# Patient Record
Sex: Female | Born: 1970 | Race: Black or African American | Hispanic: No | Marital: Single | Smoking: Former smoker
Health system: Southern US, Community
[De-identification: ages and names within clinical notes are randomized; demographics above are authoritative.]

## PROBLEM LIST (undated history)

## (undated) DIAGNOSIS — C50919 Malignant neoplasm of unspecified site of unspecified female breast: Secondary | ICD-10-CM

## (undated) HISTORY — PX: MASTECTOMY: SHX3

---

## 2004-01-19 ENCOUNTER — Emergency Department (HOSPITAL_COMMUNITY): Admission: EM | Admit: 2004-01-19 | Discharge: 2004-01-19 | Payer: Self-pay | Admitting: Emergency Medicine

## 2010-12-12 ENCOUNTER — Emergency Department (HOSPITAL_BASED_OUTPATIENT_CLINIC_OR_DEPARTMENT_OTHER)
Admission: EM | Admit: 2010-12-12 | Discharge: 2010-12-12 | Disposition: A | Discharge status: Home / Self Care | Payer: Self-pay | Attending: Emergency Medicine | Admitting: Emergency Medicine

## 2010-12-12 ENCOUNTER — Encounter: Payer: Self-pay | Admitting: *Deleted

## 2010-12-12 DIAGNOSIS — N61 Mastitis without abscess: Secondary | ICD-10-CM | POA: Insufficient documentation

## 2010-12-12 MED ORDER — CLINDAMYCIN HCL 150 MG PO CAPS: 150.0000 mg | ORAL_CAPSULE | Freq: Four times a day (QID) | ORAL | Status: DC

## 2010-12-12 MED ORDER — CLINDAMYCIN HCL 150 MG PO CAPS: 150.0000 mg | ORAL_CAPSULE | Freq: Four times a day (QID) | ORAL | Status: AC

## 2010-12-12 MED ORDER — HYDROCODONE-ACETAMINOPHEN 5-500 MG PO TABS: 1.0000 | ORAL_TABLET | Freq: Four times a day (QID) | ORAL | Status: AC | PRN

## 2010-12-12 MED ORDER — CLINDAMYCIN HCL 150 MG PO CAPS
300.0000 mg | ORAL_CAPSULE | Freq: Once | ORAL | Status: AC
  Administered 2010-12-12: 300 mg via ORAL
  Filled 2010-12-12: qty 2

## 2010-12-12 NOTE — ED Provider Notes (Signed)
History    Scribed for Ethelda Chick, MD, the patient was seen in room MH04/MH04. This chart was scribed by Katha Cabal.   CSN: [id number] Arrival date & time: 12/12/2010  4:41 PM   First MD Initiated Contact with Patient 12/12/10 1714      Chief Complaint  Patient presents with  . Breast Problem    (Consider location/radiation/quality/duration/timing/severity/associated sxs/prior treatment) HPI Jane Diaz is a 40 y.o. female who presents to the Emergency Department complaining of gradual worsening of persistent mild to moderate left breast lump for the past month. Lump is swollen with surrounding red.  Lump is located around left areola. There has been no drainage or nipple drainage.  Patient reports getting boils on bilateral breasts previously that had greenish odorous drainage.  Patient states that boils usually drained in the shower.  Patient reports current lump is hard and sore.  There has been no fever.  Patient does not have hx of DM or chronic medical problems.   Current lesion has been present for approx one month and gradually increasing.  No fever, nausea or other systemic illness.  No nipple drainage  LNMP: 11/28/2010  History reviewed. No pertinent past medical history.  History reviewed. No pertinent past surgical history.  History reviewed. No pertinent family history.  History  Substance Use Topics  . Smoking status: Former Games developer  . Smokeless tobacco: Not on file  . Alcohol Use: Yes    OB History    Grav Para Term Preterm Abortions TAB SAB Ect Mult Living                  Review of Systems 10 Systems reviewed and are negative for acute change except as noted in the HPI.  Allergies  Review of patient's allergies indicates no known allergies.  Home Medications   Current Outpatient Rx  Name Route Sig Dispense Refill  . CLINDAMYCIN HCL 150 MG PO CAPS Oral Take 1 capsule (150 mg total) by mouth every 6 (six) hours. 28 capsule 0  .  HYDROCODONE-ACETAMINOPHEN 5-500 MG PO TABS Oral Take 1-2 tablets by mouth every 6 (six) hours as needed for pain. 15 tablet 0    BP 101/64  Pulse 70  Temp(Src) 98.1 F (36.7 C) (Oral)  Resp 20  Ht 5\' 4"  (1.626 m)  Wt 130 lb (58.968 kg)  BMI 22.31 kg/m2  SpO2 100%  LMP 11/28/2010  Physical Exam  Constitutional: She is oriented to person, place, and time. She appears well-developed and well-nourished. No distress.  HENT:  Head: Normocephalic and atraumatic.  Eyes: EOM are normal. Pupils are equal, round, and reactive to light.  Neck: Normal range of motion. Neck supple.  Cardiovascular: Normal rate and intact distal pulses.   Pulmonary/Chest: Effort normal. No respiratory distress.  Abdominal: Soft. There is no tenderness.  Genitourinary:       Left breast at the 2 o'clock position: 2 cm firm abcess, no fluctuance with 3 cm area of surrounding erthema, no nipple discharge, mildly tender to palpation, no surrounding lymphadenopathy    Musculoskeletal: Normal range of motion.  Neurological: She is alert and oriented to person, place, and time.  Skin: Skin is warm and dry.  Psychiatric: She has a normal mood and affect. Her behavior is normal.    ED Course  Procedures (including critical care time)   DIAGNOSTIC STUDIES: Oxygen Saturation is 100% on room air, normal by my interpretation.    COORDINATION OF CARE:  5:50 PM  Physical exam  complete.  Advised patient to do warm compresses.  Will start patient on antibiotics and follow for ultrasound.      LABS / RADIOLOGY: Labs Reviewed - No data to display No results found.       MDM   MDM: Pt with area of cellulitis overlying left breast- firm nodule below- nonfluctuant, no nipple drainage, no assoicated lymphadenopathy.  Suspect cellulitis versus abscess- given location in breast under areola/nipple and nontoxic patient with no other comorbidities, will start on antibiotics.  Pt will need ultrasound to further  characterize area.  Ultrasound has been scheduled for tomorrow here at [facility].  Pt is agreeable with this plan and understands that the plan will be dependent on the findings of the ultrasound.  Pt given first dose of clindamycin in the ED tonight.  Discharged with strict return precautions.    MEDICATIONS GIVEN IN THE E.D. Scheduled Meds:    . clindamycin  300 mg Oral Once   Continuous Infusions:    IMPRESSION: 1. Cellulitis of breast       I personally performed the services described in this documentation, which was scribed in my presence. The recorded information has been reviewed and considered.           Ethelda Chick, MD 12/12/10 [phone]

## 2010-12-12 NOTE — ED Notes (Signed)
Pt states she has noticed a knot around her left areola. It sore and hard and has gradually gotten bigger over the past month.

## 2010-12-12 NOTE — ED Notes (Signed)
Left breast is red and swollen around the areola. Tender and warm to touch. Pt states she has noticed some other areas that have come and gone that have had green, foul-smelling drainage.

## 2010-12-13 ENCOUNTER — Other Ambulatory Visit: Payer: Self-pay | Admitting: Emergency Medicine

## 2010-12-13 ENCOUNTER — Ambulatory Visit (HOSPITAL_BASED_OUTPATIENT_CLINIC_OR_DEPARTMENT_OTHER): Payer: Self-pay

## 2010-12-13 ENCOUNTER — Ambulatory Visit
Admission: RE | Admit: 2010-12-13 | Discharge: 2010-12-13 | Disposition: A | Discharge status: Home / Self Care | Payer: No Typology Code available for payment source | Source: Ambulatory Visit | Attending: Emergency Medicine | Admitting: Emergency Medicine

## 2010-12-13 ENCOUNTER — Other Ambulatory Visit (HOSPITAL_BASED_OUTPATIENT_CLINIC_OR_DEPARTMENT_OTHER): Payer: Self-pay

## 2010-12-13 DIAGNOSIS — L0291 Cutaneous abscess, unspecified: Secondary | ICD-10-CM

## 2010-12-13 MED ORDER — CEPHALEXIN 500 MG PO CAPS: 500.0000 mg | ORAL_CAPSULE | Freq: Two times a day (BID) | ORAL | Status: AC

## 2010-12-27 ENCOUNTER — Ambulatory Visit
Admission: RE | Admit: 2010-12-27 | Discharge: 2010-12-27 | Disposition: A | Discharge status: Home / Self Care | Payer: No Typology Code available for payment source | Source: Ambulatory Visit | Attending: Emergency Medicine | Admitting: Emergency Medicine

## 2010-12-27 ENCOUNTER — Other Ambulatory Visit: Payer: Self-pay | Admitting: Emergency Medicine

## 2010-12-27 ENCOUNTER — Other Ambulatory Visit: Payer: Self-pay | Admitting: Diagnostic Radiology

## 2010-12-27 DIAGNOSIS — L0291 Cutaneous abscess, unspecified: Secondary | ICD-10-CM

## 2010-12-27 MED ORDER — CEPHALEXIN 500 MG PO CAPS: 500.0000 mg | ORAL_CAPSULE | Freq: Four times a day (QID) | ORAL | Status: AC

## 2011-01-10 ENCOUNTER — Ambulatory Visit
Admission: RE | Admit: 2011-01-10 | Discharge: 2011-01-10 | Disposition: A | Discharge status: Home / Self Care | Payer: No Typology Code available for payment source | Source: Ambulatory Visit | Attending: Emergency Medicine | Admitting: Emergency Medicine

## 2011-01-10 DIAGNOSIS — L0291 Cutaneous abscess, unspecified: Secondary | ICD-10-CM

## 2016-02-04 ENCOUNTER — Other Ambulatory Visit: Payer: Self-pay | Admitting: Surgery

## 2016-02-04 DIAGNOSIS — R9389 Abnormal findings on diagnostic imaging of other specified body structures: Secondary | ICD-10-CM

## 2016-02-04 DIAGNOSIS — N63 Unspecified lump in unspecified breast: Secondary | ICD-10-CM

## 2016-02-12 ENCOUNTER — Ambulatory Visit
Admission: RE | Admit: 2016-02-12 | Discharge: 2016-02-12 | Disposition: A | Discharge status: Home / Self Care | Payer: 59 | Source: Ambulatory Visit | Attending: Surgery | Admitting: Surgery

## 2016-02-12 ENCOUNTER — Other Ambulatory Visit: Payer: Self-pay | Admitting: Surgery

## 2016-02-12 DIAGNOSIS — N63 Unspecified lump in unspecified breast: Secondary | ICD-10-CM

## 2016-02-12 DIAGNOSIS — R9389 Abnormal findings on diagnostic imaging of other specified body structures: Secondary | ICD-10-CM

## 2016-02-12 MED ORDER — GADOBENATE DIMEGLUMINE 529 MG/ML IV SOLN
18.0000 mL | Freq: Once | INTRAVENOUS | Status: AC | PRN
  Administered 2016-02-12: 18 mL via INTRAVENOUS

## 2018-11-24 IMAGING — MR MR BREAST BX W LOC DEV EA ADD LESION IMAGE BX SPEC MR GUIDE*R*
6 of 9 series · 33 of 48 positions shown · IV contrast (18ml Multihance)
Comparison: Previous exams.

ADDENDUM:
Pathology revealed SCLEROSING LESION WITH APOCRINE METAPLASIA of the
Right breast, both locations, posterior upper inner and anterior
upper inner. The findings in both breast biopsies favor sampling of
an intraductal papilloma with sclerosis. This was found to be
concordant by Dr. Hoorvaath Ohr, with excision recommended for both areas.
Pathology results were discussed with the patient by telephone. The
patient reported doing well after the biopsies with tenderness at
the sites. Post biopsy instructions and care were reviewed and
questions were answered. The patient was encouraged to call The
Surgical consultation has been arranged with Dr. Maximciuc Layouni at
[HOSPITAL] [REDACTED] [REDACTED] in [HOSPITAL][HOSPITAL] on
February 22, 2016.

Pathology results reported by Ronson Nonato, RN on 02/16/2016.
CLINICAL DATA: 45-year-old female with abnormal suspicious
enhancement throughout the upper inner right breast. For tissue
sampling of the anterior aspect of this abnormal enhancement.
EXAM:
MRI GUIDED CORE NEEDLE BIOPSY OF THE RIGHT BREAST
TECHNIQUE: Multiplanar, multisequence MR imaging of the right breast was
performed both before and after administration of intravenous
contrast.
CONTRAST:  18mL MULTIHANCE GADOBENATE DIMEGLUMINE 529 MG/ML IV SOLN

[Series 2: axial pre-cm · axial · non-contrast · 1.3mm · 0.73mm/px · z∈[-77,+150]mm · 6 of 176 slices shown]
[im 1/176]
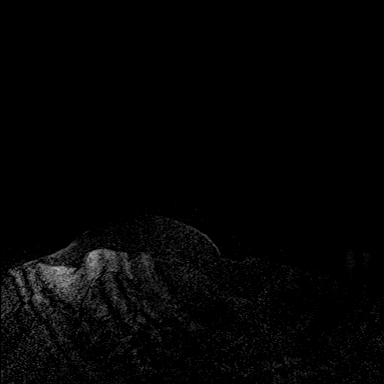
[im 36/176]
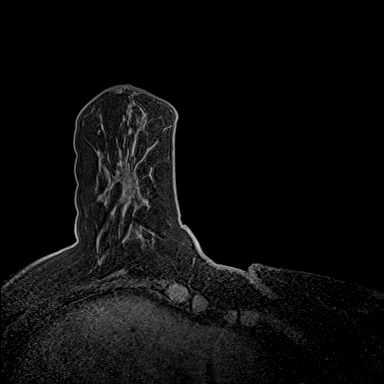
[im 71/176]
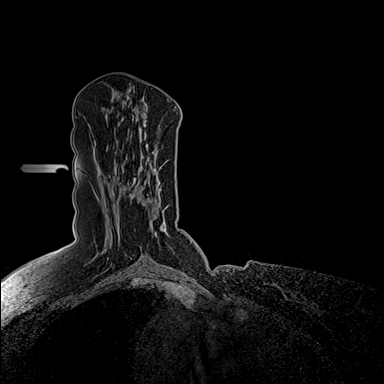
[im 106/176]
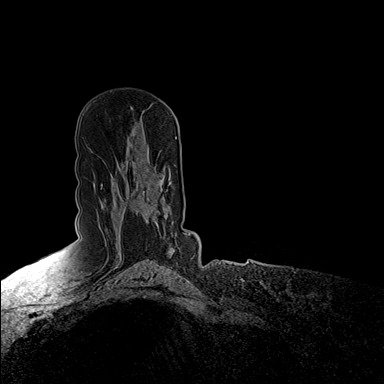
[im 141/176]
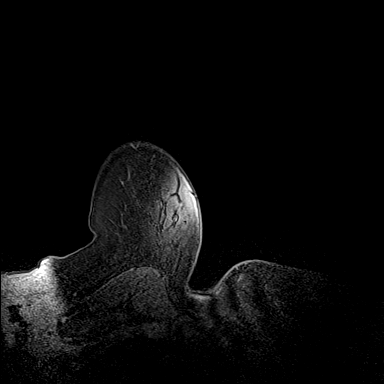
[im 176/176]
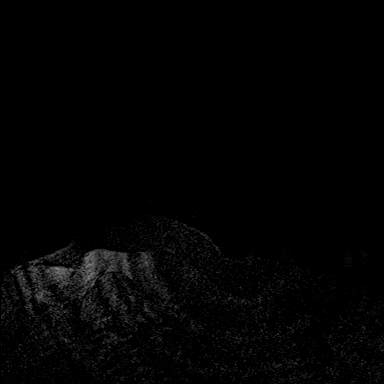

[Series 3: axial pre-cm_s2_(id) · axial · non-contrast · 1.3mm · 0.73mm/px · z∈[-77,+150]mm · 6 of 176 slices shown]
[im 1/176]
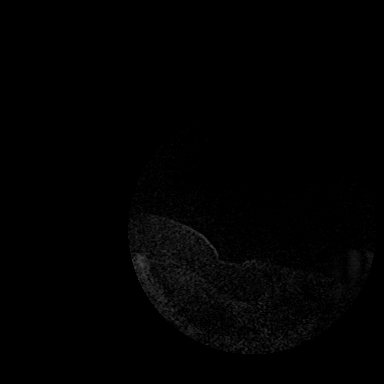
[im 36/176]
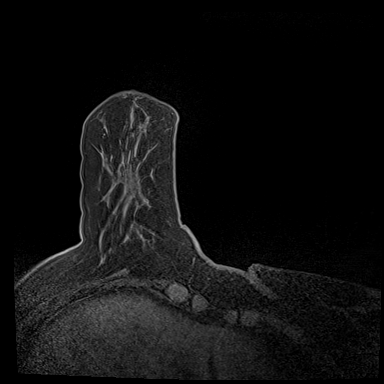
[im 71/176]
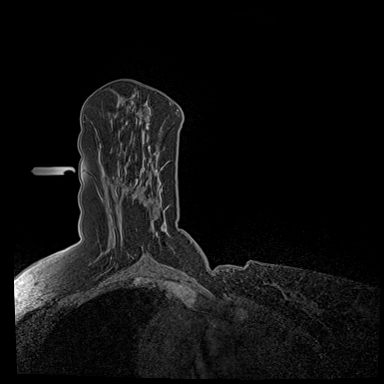
[im 106/176]
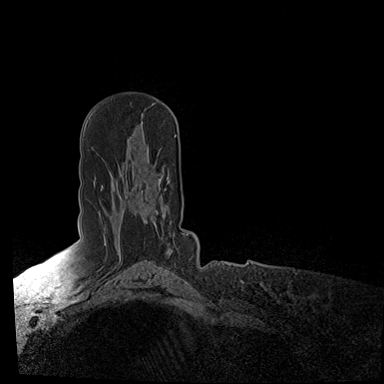
[im 141/176]
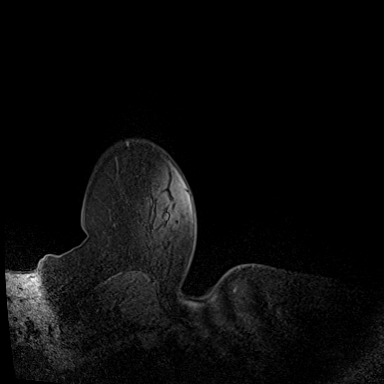
[im 176/176]
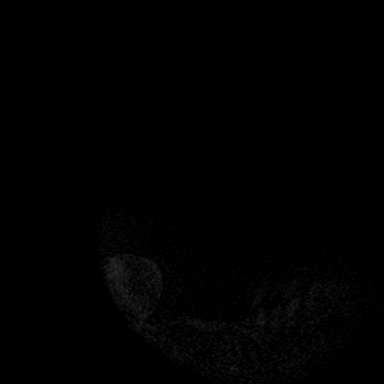

[Series 4: axial post 20 · axial · 1.3mm · 0.73mm/px · z∈[-77,+150]mm · 6 of 176 slices shown (1 of 3)]
[im 1/176]
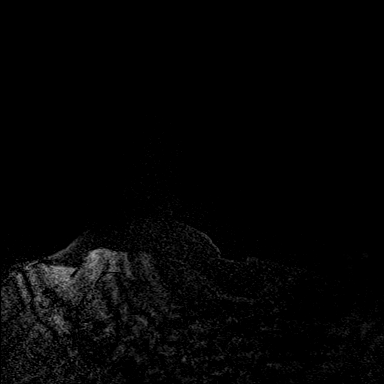
[im 36/176]
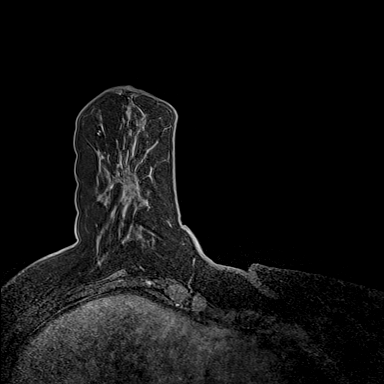
[im 71/176]
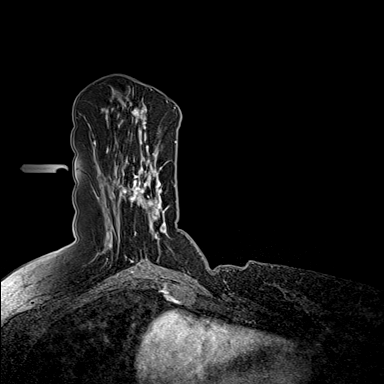
[im 106/176]
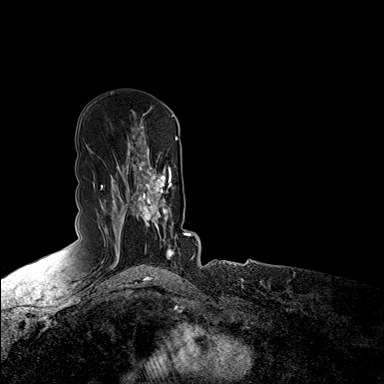
[im 141/176]
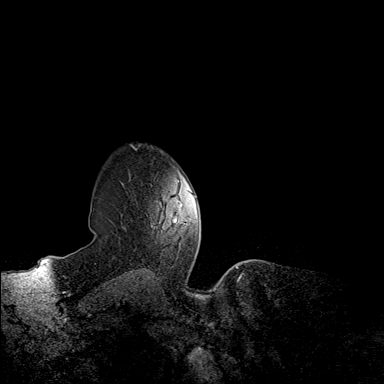
[im 176/176]
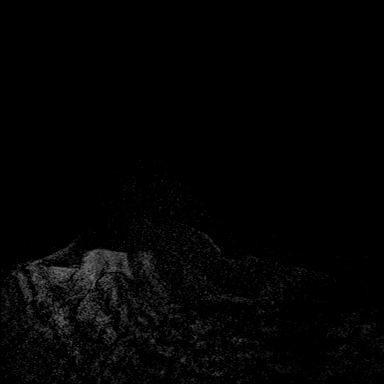

[Series 5: axial post 20 · axial · 1.3mm · 0.73mm/px · z∈[-77,+150]mm · 5 of 176 slices shown (2 of 3)]
[im 1/176]
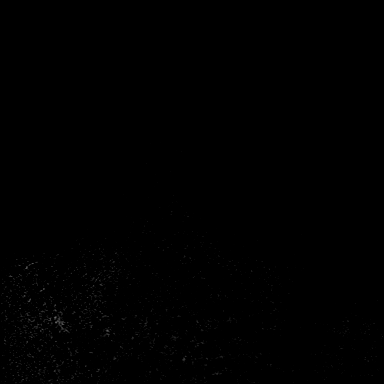
[im 44/176]
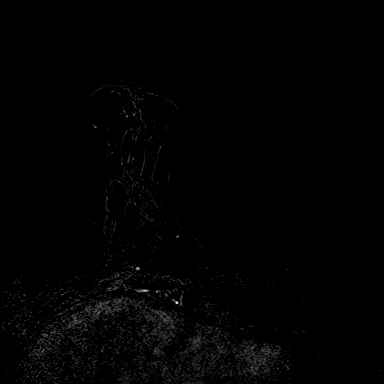
[im 88/176]
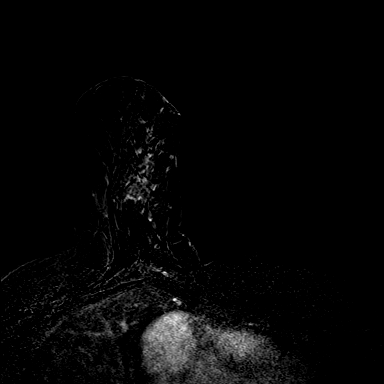
[im 132/176]
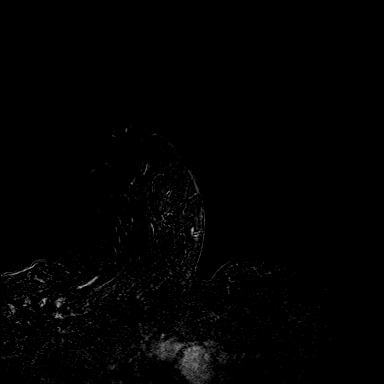
[im 176/176]
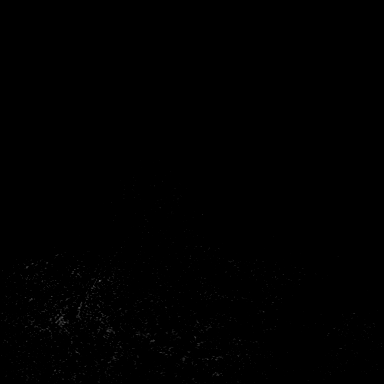

[Series 6: axial post 20 · axial · 1.3mm · 0.73mm/px · z∈[-77,+150]mm · 5 of 176 slices shown (3 of 3)]
[im 1/176]
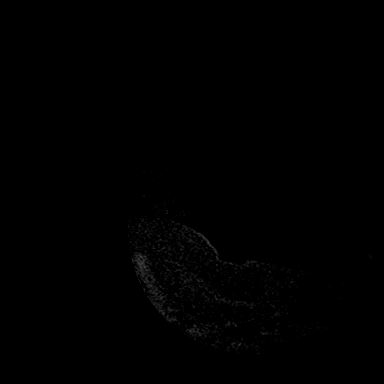
[im 44/176]
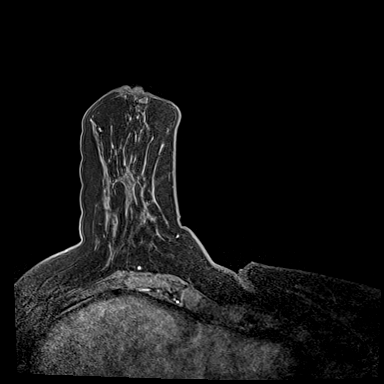
[im 88/176]
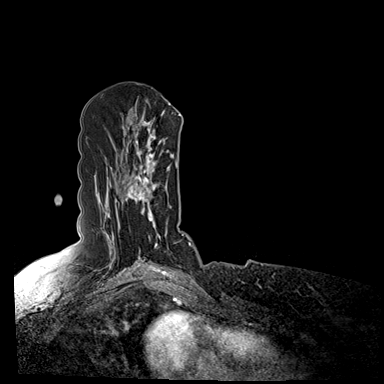
[im 132/176]
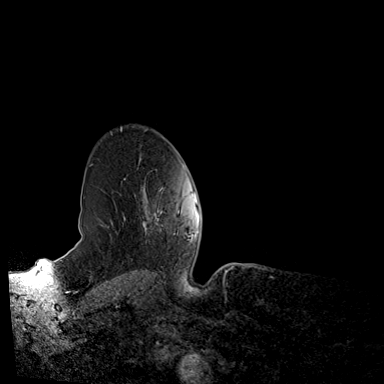
[im 176/176]
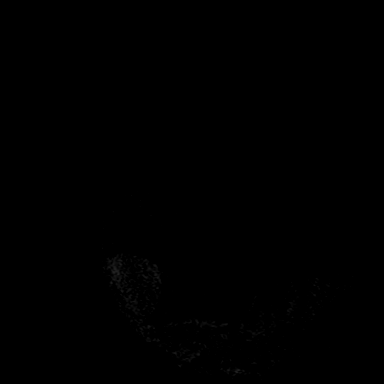

[Series 7: axial confirmation · axial · 1.3mm · 0.73mm/px · z∈[-77,+150]mm · 5 of 176 slices shown]
[im 1/176]
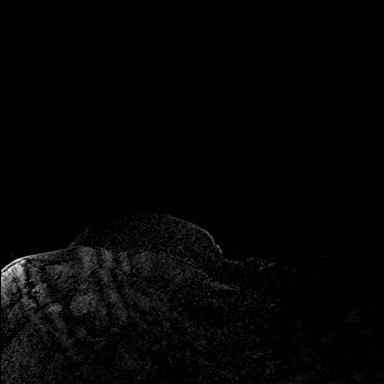
[im 44/176]
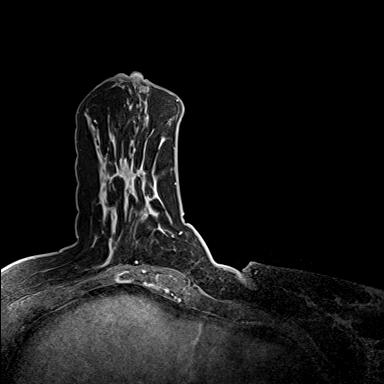
[im 88/176]
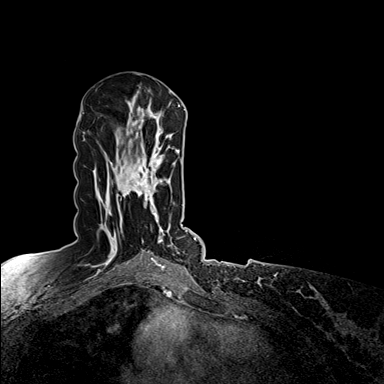
[im 132/176]
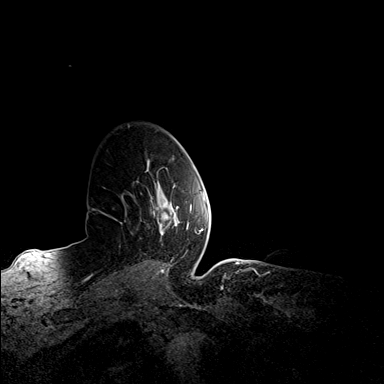
[im 176/176]
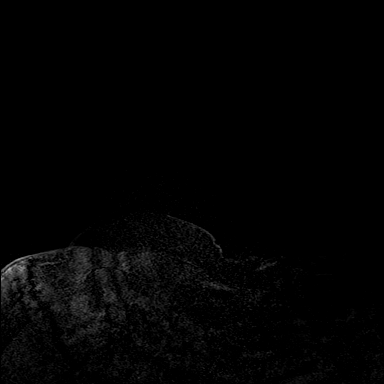

[33 of 48 positions shown; findings below may reference images not displayed]

FINDINGS: I met with the patient, and we discussed the procedure of MRI guided
biopsy, including risks, benefits, and alternatives. Specifically,
we discussed the risks of infection, bleeding, tissue injury, clip
migration, and inadequate sampling. Informed, written consent was
given. The usual time out protocol was performed immediately prior
to the procedure.

Using sterile technique, 1% Lidocaine, MRI guidance, and a 9 gauge
vacuum assisted device, biopsy was performed of the anterior aspect
of the abnormal enhancement throughout the upper inner right breast
using a lateral approach. At the conclusion of the procedure, a
cylinder-shaped tissue marker clip was deployed into the biopsy
cavity. Follow-up 2-view mammogram was performed and dictated
separately.
IMPRESSION: MRI guided biopsy of anterior aspect of abnormal enhancement
throughout the upper inner right breast. No apparent complications.

Pathology will be followed.

## 2019-12-23 ENCOUNTER — Emergency Department (HOSPITAL_BASED_OUTPATIENT_CLINIC_OR_DEPARTMENT_OTHER)
Admission: EM | Admit: 2019-12-23 | Discharge: 2019-12-23 | Disposition: A | Discharge status: Home / Self Care | Payer: BC Managed Care – PPO | Attending: Emergency Medicine | Admitting: Emergency Medicine

## 2019-12-23 ENCOUNTER — Other Ambulatory Visit: Payer: Self-pay

## 2019-12-23 ENCOUNTER — Encounter (HOSPITAL_BASED_OUTPATIENT_CLINIC_OR_DEPARTMENT_OTHER): Payer: Self-pay | Admitting: Emergency Medicine

## 2019-12-23 DIAGNOSIS — K0889 Other specified disorders of teeth and supporting structures: Secondary | ICD-10-CM | POA: Insufficient documentation

## 2019-12-23 DIAGNOSIS — Z87891 Personal history of nicotine dependence: Secondary | ICD-10-CM | POA: Insufficient documentation

## 2019-12-23 MED ORDER — KETOROLAC TROMETHAMINE 15 MG/ML IJ SOLN
30.0000 mg | Freq: Once | INTRAMUSCULAR | Status: AC
  Administered 2019-12-23: 30 mg via INTRAMUSCULAR
  Filled 2019-12-23: qty 2

## 2019-12-23 MED ORDER — NAPROXEN 500 MG PO TABS: 500.0000 mg | ORAL_TABLET | Freq: Two times a day (BID) | ORAL | 0 refills | Status: AC

## 2019-12-23 MED ORDER — PENICILLIN V POTASSIUM 500 MG PO TABS: 500.0000 mg | ORAL_TABLET | Freq: Four times a day (QID) | ORAL | 0 refills | Status: AC

## 2019-12-23 MED ORDER — PENICILLIN V POTASSIUM 250 MG PO TABS
500.0000 mg | ORAL_TABLET | Freq: Once | ORAL | Status: AC
  Administered 2019-12-23: 500 mg via ORAL
  Filled 2019-12-23: qty 2

## 2019-12-23 MED ORDER — HYDROCODONE-ACETAMINOPHEN 5-325 MG PO TABS
1.0000 | ORAL_TABLET | Freq: Once | ORAL | Status: DC
  Filled 2019-12-23: qty 1

## 2019-12-23 NOTE — Discharge Instructions (Addendum)
You were seen today for dental pain.  You will be treated for presumed infection.  It is important that you follow-up with dentistry for definitive care.  Take antibiotics as prescribed.  If you develop facial swelling or worsening pain, difficulty swallowing, or any new or worsening symptoms you should be reevaluated.

## 2019-12-23 NOTE — ED Triage Notes (Signed)
Reports pain on left upper and right lower mouth as well as right ear pain that started today.  Reports taking a bc with no relief.

## 2019-12-23 NOTE — ED Provider Notes (Signed)
[place] EMERGENCY DEPARTMENT Provider Note   CSN: [id number] Arrival date & time: 12/23/19  0250     History Chief Complaint  Patient presents with  . Dental Pain    Carlota Philley is a 49 y.o. female.  HPI     This is a 49 year old female with no reported past medical history presents with dental pain.  Patient reports 1 day history of worsening right lower jaw dental pain.  It radiates into her right ear.  She rates her pain at 10 out of 10.  She has taken [facility] powder with minimal relief.  She does not have a dentist and denies any recent dental procedures.  No fevers or difficulty swallowing.  History reviewed. No pertinent past medical history.  There are no problems to display for this patient.   No past surgical history on file.   OB History   No obstetric history on file.     No family history on file.  Social History   Tobacco Use  . Smoking status: Former Smoker  Substance Use Topics  . Alcohol use: Yes  . Drug use: No    Home Medications Prior to Admission medications   Not on File    Allergies    Patient has no known allergies.  Review of Systems   Review of Systems  Constitutional: Negative for fever.  HENT: Positive for dental problem. Negative for facial swelling and trouble swallowing.   All other systems reviewed and are negative.   Physical Exam Updated Vital Signs BP (!) 160/132 (BP Location: Right Arm) Comment: unable to get an accurate bp due to patient thrashing around  Pulse 88   Temp 97.8 F (36.6 C) (Oral)   Resp 18   Ht 1.6 m (5\' 3" )   Wt 77.1 kg   SpO2 100%   BMI 30.11 kg/m   Physical Exam Vitals and nursing note reviewed.  Constitutional:      Appearance: She is well-developed.     Comments: Uncomfortable appearing but nontoxic  HENT:     Head: Normocephalic and atraumatic.     Mouth/Throat:     Mouth: Mucous membranes are moist.     Comments: Generally poor dentition with multiple missing teeth  and dental caries noted, there is tenderness to palpation along the periapical region of the right lower gumline, no palpable abscess, no trismus or fullness noted under the tongue Eyes:     Pupils: Pupils are equal, round, and reactive to light.  Cardiovascular:     Rate and Rhythm: Normal rate and regular rhythm.     Heart sounds: Normal heart sounds.  Pulmonary:     Effort: Pulmonary effort is normal. No respiratory distress.     Breath sounds: No wheezing.  Abdominal:     Palpations: Abdomen is soft.  Musculoskeletal:     Cervical back: Neck supple.  Skin:    General: Skin is warm and dry.  Neurological:     Mental Status: She is alert and oriented to person, place, and time.  Psychiatric:        Mood and Affect: Mood normal.     ED Results / Procedures / Treatments   Labs (all labs ordered are listed, but only abnormal results are displayed) Labs Reviewed - No data to display  EKG None  Radiology No results found.  Procedures Procedures (including critical care time)  Medications Ordered in ED Medications  ketorolac (TORADOL) 15 MG/ML injection 30 mg (has no administration in  time range)  penicillin v potassium (VEETID) tablet 500 mg (has no administration in time range)  HYDROcodone-acetaminophen (NORCO/VICODIN) 5-325 MG per tablet 1 tablet (has no administration in time range)    ED Course  I have reviewed the triage vital signs and the nursing notes.  Pertinent labs & imaging results that were available during my care of the patient were reviewed by me and considered in my medical decision making (see chart for details).    MDM Rules/Calculators/A&P                          Patient presents with dental pain.  Overall nontoxic and vital signs are reassuring.  ABCs are intact.  No palpable or drainable abscess noted all the likely underlying infection.  No evidence of deep space infection such as Ludwigs.  Patient was given Toradol and Percocet.  Will  discharge with naproxen and penicillin to treat for presumed underlying infection.  Patient was given dental resources.  After history, exam, and medical workup I feel the patient has been appropriately medically screened and is safe for discharge home. Pertinent diagnoses were discussed with the patient. Patient was given return precautions.  Final Clinical Impression(s) / ED Diagnoses Final diagnoses:  Pain, dental    Rx / DC Orders ED Discharge Orders    None       Gina Costilla, Barbette Hair, MD 12/23/19 [phone]

## 2019-12-23 NOTE — ED Notes (Addendum)
Pt right lower molar pain. States having trouble speaking due to pain.

## 2021-12-15 ENCOUNTER — Encounter (HOSPITAL_BASED_OUTPATIENT_CLINIC_OR_DEPARTMENT_OTHER): Payer: Self-pay | Admitting: Emergency Medicine

## 2021-12-15 ENCOUNTER — Emergency Department (HOSPITAL_BASED_OUTPATIENT_CLINIC_OR_DEPARTMENT_OTHER)
Admission: EM | Admit: 2021-12-15 | Discharge: 2021-12-16 | Disposition: A | Discharge status: Home / Self Care | Payer: BC Managed Care – PPO | Attending: Emergency Medicine | Admitting: Emergency Medicine

## 2021-12-15 DIAGNOSIS — R059 Cough, unspecified: Secondary | ICD-10-CM | POA: Diagnosis present

## 2021-12-15 DIAGNOSIS — J069 Acute upper respiratory infection, unspecified: Secondary | ICD-10-CM | POA: Diagnosis not present

## 2021-12-15 HISTORY — DX: Malignant neoplasm of unspecified site of unspecified female breast: C50.919

## 2021-12-15 MED ORDER — CEFTRIAXONE SODIUM 500 MG IJ SOLR
500.0000 mg | Freq: Once | INTRAMUSCULAR | Status: AC
  Administered 2021-12-16: 500 mg via INTRAMUSCULAR
  Filled 2021-12-15: qty 500

## 2021-12-15 MED ORDER — AZITHROMYCIN 250 MG PO TABS
1000.0000 mg | ORAL_TABLET | Freq: Once | ORAL | Status: AC
  Administered 2021-12-16: 1000 mg via ORAL
  Filled 2021-12-15: qty 4

## 2021-12-15 MED ORDER — AMOXICILLIN-POT CLAVULANATE 875-125 MG PO TABS: 1.0000 | ORAL_TABLET | Freq: Two times a day (BID) | ORAL | 0 refills | Status: AC

## 2021-12-15 MED ORDER — BENZONATATE 100 MG PO CAPS: 100.0000 mg | ORAL_CAPSULE | Freq: Three times a day (TID) | ORAL | 0 refills | Status: AC

## 2021-12-15 MED ORDER — ONDANSETRON 4 MG PO TBDP: ORAL_TABLET | ORAL | 0 refills | Status: AC

## 2021-12-15 NOTE — Discharge Instructions (Signed)
Take tylenol 2 pills 4 times a day and motrin 4 pills 3 times a day.  Drink plenty of fluids.  Return for worsening shortness of breath, headache, confusion. Follow up with your family doctor.   I have sent to your tests off for the common sexually transmitted diseases.  This typically takes about 48 hours or so.  If you have MyChart you can check the results on your own.

## 2021-12-15 NOTE — ED Triage Notes (Signed)
Pt states for the past two weeks she has had cold symptoms with congestion and cough  Pt has been using mucinex and ginger tea  Pt states she has been coughing up green stuff  and is hoarse  Pt also states she has been having vaginal discharge that is clear in color, vaginal odor and mild itching

## 2021-12-16 LAB — WET PREP, GENITAL
Clue Cells Wet Prep HPF POC: NONE SEEN
Sperm: NONE SEEN
Trich, Wet Prep: NONE SEEN
WBC, Wet Prep HPF POC: >=10 — AB (ref <10)
Yeast Wet Prep HPF POC: NONE SEEN

## 2021-12-16 LAB — HIV ANTIBODY (ROUTINE TESTING W REFLEX): HIV Screen 4th Generation wRfx: NONREACTIVE

## 2021-12-16 MED ORDER — LIDOCAINE HCL (PF) 1 % IJ SOLN
INTRAMUSCULAR | Status: AC
  Filled 2021-12-16: qty 5

## 2021-12-16 NOTE — ED Provider Notes (Signed)
[place] [facility] Provider Note   CSN: [id number] Arrival date & time: 12/15/21  2318     History  Chief Complaint  Patient presents with   multiple complaints    Jane Diaz is a 51 y.o. female.  51 yo F with a chief complaints of cough and congestion.  This been going on for a couple weeks primarily consisting of cough.  She also feels like she might have contracted a sexually transmitted disease.  Feels like she had a change in her discharge and it has a foul odor.  She denies any abdominal pain.  Denies nausea denies vomiting.        Home Medications Prior to Admission medications   Medication Sig Start Date End Date Taking? Authorizing Provider  amoxicillin-clavulanate (AUGMENTIN) 875-125 MG tablet Take 1 tablet by mouth every 12 (twelve) hours. 12/15/21  Yes Deno Etienne, DO  benzonatate (TESSALON) 100 MG capsule Take 1 capsule (100 mg total) by mouth every 8 (eight) hours. 12/15/21  Yes Deno Etienne, DO  ondansetron (ZOFRAN-ODT) 4 MG disintegrating tablet '4mg'$  ODT q4 hours prn nausea/vomit 12/15/21  Yes Deno Etienne, DO  naproxen (NAPROSYN) 500 MG tablet Take 1 tablet (500 mg total) by mouth 2 (two) times daily. 12/23/19   Horton, Barbette Hair, MD  penicillin v potassium (VEETID) 500 MG tablet Take 1 tablet (500 mg total) by mouth 4 (four) times daily. 12/23/19   Horton, Barbette Hair, MD      Allergies    Patient has no known allergies.    Review of Systems   Review of Systems  Physical Exam Updated Vital Signs BP (!) 146/87 (BP Location: Left Arm)   Pulse 87   Temp 98.2 F (36.8 C) (Oral)   Resp 18   Ht '5\' 4"'$  (1.626 m)   Wt 92.5 kg   LMP 11/28/2010   SpO2 95%   BMI 35.02 kg/m  Physical Exam Vitals and nursing note reviewed.  Constitutional:      General: She is not in acute distress.    Appearance: She is well-developed. She is not diaphoretic.  HENT:     Head: Normocephalic and atraumatic.     Comments: Swollen turbinates, posterior  nasal drip, right maxillary sinus is exquisitely tender to percussion, tm normal bilaterally.   Eyes:     Pupils: Pupils are equal, round, and reactive to light.  Cardiovascular:     Rate and Rhythm: Normal rate and regular rhythm.     Heart sounds: No murmur heard.    No friction rub. No gallop.  Pulmonary:     Effort: Pulmonary effort is normal.     Breath sounds: No wheezing or rales.  Abdominal:     General: There is no distension.     Palpations: Abdomen is soft.     Tenderness: There is no abdominal tenderness.  Musculoskeletal:        General: No tenderness.     Cervical back: Normal range of motion and neck supple.  Skin:    General: Skin is warm and dry.  Neurological:     Mental Status: She is alert and oriented to person, place, and time.  Psychiatric:        Behavior: Behavior normal.     ED Results / Procedures / Treatments   Labs (all labs ordered are listed, but only abnormal results are displayed) Labs Reviewed  WET PREP, GENITAL - Abnormal; Notable for the following components:      Result Value  WBC, Wet Prep HPF POC >=10 (*)    All other components within normal limits  RPR  HIV ANTIBODY (ROUTINE TESTING W REFLEX)  GC/CHLAMYDIA PROBE AMP ([place]) NOT AT [facility]    EKG None  Radiology No results found.  Procedures Procedures    Medications Ordered in ED Medications  cefTRIAXone (ROCEPHIN) injection 500 mg (500 mg Intramuscular Given 12/16/21 0016)  azithromycin (ZITHROMAX) tablet 1,000 mg (1,000 mg Oral Given 12/16/21 0014)  lidocaine (PF) (XYLOCAINE) 1 % injection (  Given 12/16/21 0022)    ED Course/ Medical Decision Making/ A&P                           Medical Decision Making Amount and/or Complexity of Data Reviewed Labs: ordered.  Risk Prescription drug management.   51 yo F with a chief complaints of cough and congestion.  Is been going on for a couple weeks.  Clinically the patient has sinusitis.  Has exquisite tenderness  over the right maxillary sinus.  We will treat with antibiotics.  Patient also has a secondary complaint is complaining of concern for possible STD.  We will have the patient self swab here.  We will treat presumptively for gonorrhea and chlamydia.  Wet prep negative.  Discharge home.  1:16 AM:  I have discussed the diagnosis/risks/treatment options with the patient.  Evaluation and diagnostic testing in the emergency department does not suggest an emergent condition requiring admission or immediate intervention beyond what has been performed at this time.  They will follow up with PCP. We also discussed returning to the ED immediately if new or worsening sx occur. We discussed the sx which are most concerning (e.g., sudden worsening pain, fever, inability to tolerate by mouth) that necessitate immediate return. Medications administered to the patient during their visit and any new prescriptions provided to the patient are listed below.  Medications given during this visit Medications  cefTRIAXone (ROCEPHIN) injection 500 mg (500 mg Intramuscular Given 12/16/21 0016)  azithromycin (ZITHROMAX) tablet 1,000 mg (1,000 mg Oral Given 12/16/21 0014)  lidocaine (PF) (XYLOCAINE) 1 % injection (  Given 12/16/21 0022)     The patient appears reasonably screen and/or stabilized for discharge and I doubt any other medical condition or other [facility] requiring further screening, evaluation, or treatment in the ED at this time prior to discharge.          Final Clinical Impression(s) / ED Diagnoses Final diagnoses:  URI with cough and congestion    Rx / DC Orders ED Discharge Orders          Ordered    amoxicillin-clavulanate (AUGMENTIN) 875-125 MG tablet  Every 12 hours        12/15/21 2346    benzonatate (TESSALON) 100 MG capsule  Every 8 hours        12/15/21 2346    ondansetron (ZOFRAN-ODT) 4 MG disintegrating tablet        12/15/21 2346              Deno Etienne, DO 12/16/21 0116

## 2021-12-17 LAB — GC/CHLAMYDIA PROBE AMP (~~LOC~~) NOT AT ARMC
Chlamydia: NEGATIVE
Comment: NEGATIVE
Comment: NORMAL
Neisseria Gonorrhea: NEGATIVE

## 2021-12-17 LAB — RPR: RPR Ser Ql: NONREACTIVE

## 2022-09-14 ENCOUNTER — Encounter (HOSPITAL_BASED_OUTPATIENT_CLINIC_OR_DEPARTMENT_OTHER): Payer: Self-pay | Admitting: Emergency Medicine

## 2022-09-14 ENCOUNTER — Emergency Department (HOSPITAL_BASED_OUTPATIENT_CLINIC_OR_DEPARTMENT_OTHER)
Admission: EM | Admit: 2022-09-14 | Discharge: 2022-09-14 | Disposition: A | Discharge status: Home / Self Care | Payer: BC Managed Care – PPO | Attending: Emergency Medicine | Admitting: Emergency Medicine

## 2022-09-14 ENCOUNTER — Other Ambulatory Visit: Payer: Self-pay

## 2022-09-14 DIAGNOSIS — U071 COVID-19: Secondary | ICD-10-CM | POA: Insufficient documentation

## 2022-09-14 DIAGNOSIS — Z853 Personal history of malignant neoplasm of breast: Secondary | ICD-10-CM | POA: Insufficient documentation

## 2022-09-14 LAB — SARS CORONAVIRUS 2 BY RT PCR: SARS Coronavirus 2 by RT PCR: POSITIVE — AB

## 2022-09-14 MED ORDER — KETOROLAC TROMETHAMINE 15 MG/ML IJ SOLN
30.0000 mg | Freq: Once | INTRAMUSCULAR | Status: AC
  Administered 2022-09-14: 30 mg via INTRAMUSCULAR
  Filled 2022-09-14: qty 2

## 2022-09-14 NOTE — ED Provider Notes (Signed)
[place] [facility] Provider Note   CSN: [id number] Arrival date & time: 09/14/22  2023     History  Chief Complaint  Patient presents with   Nasal Congestion    Jane Diaz is a 52 y.o. female.  Patient with past history of breast cancer presents to the emergency department for evaluation of chills, body aches, sore throat, cough and fatigue.  Symptoms started this morning.  Unrelieved with home medications.  No known sick contacts.  She tried to go to work but was sent home.  She states that she feels terrible.  No vomiting or diarrhea.  She has had some coughing with some mucus.  No respiratory distress.       Home Medications Prior to Admission medications   Medication Sig Start Date End Date Taking? Authorizing Provider  amoxicillin-clavulanate (AUGMENTIN) 875-125 MG tablet Take 1 tablet by mouth every 12 (twelve) hours. 12/15/21   Melene Plan, DO  benzonatate (TESSALON) 100 MG capsule Take 1 capsule (100 mg total) by mouth every 8 (eight) hours. 12/15/21   Melene Plan, DO  naproxen (NAPROSYN) 500 MG tablet Take 1 tablet (500 mg total) by mouth 2 (two) times daily. 12/23/19   Shon Baton, MD  ondansetron (ZOFRAN-ODT) 4 MG disintegrating tablet 4mg  ODT q4 hours prn nausea/vomit 12/15/21   Melene Plan, DO  penicillin v potassium (VEETID) 500 MG tablet Take 1 tablet (500 mg total) by mouth 4 (four) times daily. 12/23/19   Horton, Mayer Masker, MD      Allergies    Doxycycline    Review of Systems   Review of Systems  Physical Exam Updated Vital Signs BP (!) 124/90   Pulse 69   Temp 99.1 F (37.3 C)   Resp 20   Ht 5\' 4"  (1.626 m)   LMP 11/28/2010   SpO2 100%   BMI 35.02 kg/m  Physical Exam Vitals and nursing note reviewed.  Constitutional:      Appearance: She is well-developed.  HENT:     Head: Normocephalic and atraumatic.     Jaw: No trismus.     Right Ear: Tympanic membrane, ear canal and external ear normal.      Left Ear: Tympanic membrane, ear canal and external ear normal.     Nose: Nose normal. No mucosal edema or rhinorrhea.     Mouth/Throat:     Mouth: Mucous membranes are moist. Mucous membranes are not dry. No oral lesions.     Pharynx: Uvula midline. Posterior oropharyngeal erythema present. No oropharyngeal exudate or uvula swelling.     Tonsils: No tonsillar abscesses.  Eyes:     General:        Right eye: No discharge.        Left eye: No discharge.     Conjunctiva/sclera: Conjunctivae normal.  Cardiovascular:     Rate and Rhythm: Normal rate and regular rhythm.     Heart sounds: Normal heart sounds.  Pulmonary:     Effort: Pulmonary effort is normal. No respiratory distress.     Breath sounds: Normal breath sounds. No wheezing or rales.  Abdominal:     Palpations: Abdomen is soft.     Tenderness: There is no abdominal tenderness.  Musculoskeletal:     Cervical back: Normal range of motion and neck supple.  Lymphadenopathy:     Cervical: No cervical adenopathy.  Skin:    General: Skin is warm and dry.  Neurological:     Mental Status:  She is alert.  Psychiatric:     Comments: Tearful     ED Results / Procedures / Treatments   Labs (all labs ordered are listed, but only abnormal results are displayed) Labs Reviewed  SARS CORONAVIRUS 2 BY RT PCR - Abnormal; Notable for the following components:      Result Value   SARS Coronavirus 2 by RT PCR POSITIVE (*)    All other components within normal limits    EKG None  Radiology No results found.  Procedures Procedures    Medications Ordered in ED Medications  ketorolac (TORADOL) 15 MG/ML injection 30 mg (has no administration in time range)    ED Course/ Medical Decision Making/ A&P    Patient seen and examined. History obtained directly from patient. Work-up including labs, imaging, EKG ordered in triage, if performed, were reviewed.    Labs/EKG: Independently reviewed and interpreted.  This included: COVID  test positive  Imaging: None ordered  Medications/Fluids: Ordered: IM Toradol, patient is very uncomfortable from the myalgias  Most recent vital signs reviewed and are as follows: BP (!) 124/90   Pulse 69   Temp 99.1 F (37.3 C)   Resp 20   Ht 5\' 4"  (1.626 m)   LMP 11/28/2010   SpO2 100%   BMI 35.02 kg/m   Initial impression: COVID-19 infection, mild, reassuring vital signs  Detailed discussion had with with patient regarding COVID-19 precautions and written instructions given as well.  We discussed need to isolate themselves for 5 days from onset of symptoms and have 24 hours of improvement prior to breaking isolation.  We discussed that when breaking isolation, mask wearing for 5 additional days is required.  We discussed signs symptoms to return which include worsening shortness of breath, trouble breathing, or increased work of breathing.  Also return with persistent vomiting, confusion, passing out, or if they have any other concerns. Counseled on the need for rest and good hydration. Discussed that high-risk contacts should be aware of positive result and they need to quarantine and be tested if they develop any symptoms. Patient verbalizes understanding.                                 Medical Decision Making Risk Prescription drug management.   Patient with COVID-19.  Low concern for clinically significant pneumonia.  Vital signs are normal without tachypnea or tachycardia.  No hypoxia.  Patient fluids.  She should be able to treat her symptoms supportively.  Return instructions discussed as above.        Final Clinical Impression(s) / ED Diagnoses Final diagnoses:  COVID-19    Rx / DC Orders ED Discharge Orders     None         Renne Crigler, Cordelia Poche 09/14/22 2131    Lonell Grandchild, MD 09/14/22 [phone]

## 2022-09-14 NOTE — Discharge Instructions (Signed)
Please read and follow all provided instructions.  Your diagnoses today include:  1. COVID-19     Tests performed today include: Vital signs. See below for your results today.  COVID test - positive  Medications prescribed:  None  Take any prescribed medications only as directed. Treatment for your infection is aimed at treating the symptoms. There are no medications, such as antibiotics, that will cure your infection.   Home care instructions:  Follow any educational materials contained in this packet.   Your illness is contagious and can be spread to others, especially during the first 3 or 4 days. It cannot be cured by antibiotics or other medicines. Take basic precautions such as washing your hands often, covering your mouth when you cough or sneeze, and avoiding public places where you could spread your illness to others.   Please continue drinking plenty of fluids.  Use over-the-counter medicines as needed as directed on packaging for symptom relief.  You may also use ibuprofen or tylenol as directed on packaging for pain or fever.  Do not take multiple medicines containing Tylenol or acetaminophen to avoid taking too much of this medication.  If you are positive for Covid-19, you should isolate yourself and not be exposed to other people for 5 days after your symptoms began. If you are not feeling better at day 5, you need to isolate yourself for a total of 10 days. If you are feeling better by day 5, you should wear a mask properly, over your nose and mouth, at all times while around other people until 10 days after your symptoms started.   Follow-up instructions: Please follow-up with your primary care provider as needed for further evaluation of your symptoms if you are not feeling better.   Return instructions:  Please return to the Emergency Department if you experience worsening symptoms.  Return to the emergency department if you have worsening shortness of breath breathing  or increased work of breathing, persistent vomiting RETURN IMMEDIATELY IF you develop shortness of breath, confusion or altered mental status, a new rash, become dizzy, faint, or poorly responsive, or are unable to be cared for at home. Please return if you have persistent vomiting and cannot keep down fluids or develop a fever that is not controlled by tylenol or motrin.   Please return if you have any other emergent concerns.  Additional Information:  Your vital signs today were: BP (!) 124/90   Pulse 69   Temp 99.1 F (37.3 C)   Resp 20   Ht 5\' 4"  (1.626 m)   LMP 11/28/2010   SpO2 100%   BMI 35.02 kg/m  If your blood pressure (BP) was elevated above 135/85 this visit, please have this repeated by your doctor within one month. --------------

## 2022-09-14 NOTE — ED Notes (Signed)
Nasal congestion, body aches  and HA started this am Covid +

## 2022-09-14 NOTE — ED Triage Notes (Signed)
Pt c/o nasal congestion, HA, body aches that started this morning

## 2022-09-24 ENCOUNTER — Encounter (HOSPITAL_BASED_OUTPATIENT_CLINIC_OR_DEPARTMENT_OTHER): Payer: Self-pay | Admitting: Emergency Medicine

## 2022-09-24 ENCOUNTER — Other Ambulatory Visit: Payer: Self-pay

## 2022-09-24 ENCOUNTER — Emergency Department (HOSPITAL_BASED_OUTPATIENT_CLINIC_OR_DEPARTMENT_OTHER)
Admission: EM | Admit: 2022-09-24 | Discharge: 2022-09-24 | Disposition: A | Discharge status: Home / Self Care | Payer: 59 | Attending: Emergency Medicine | Admitting: Emergency Medicine

## 2022-09-24 DIAGNOSIS — H579 Unspecified disorder of eye and adnexa: Secondary | ICD-10-CM | POA: Diagnosis present

## 2022-09-24 DIAGNOSIS — H1013 Acute atopic conjunctivitis, bilateral: Secondary | ICD-10-CM | POA: Diagnosis not present

## 2022-09-24 NOTE — ED Provider Notes (Signed)
[place] [facility] Provider Note   CSN: [id number] Arrival date & time: 09/24/22  2153     History Chief Complaint  Patient presents with   Conjunctivitis    Jane Diaz is a 52 y.o. female.  Patient presents to the emergency department concerns of eye irritation.  Reports that she was told by her work that she needed to come to the emergency department or other facility for evaluation of symptoms.  Patient was diagnosed with COVID last week and feels that she has now largely improved since then.  Still endorses some mild nasal congestion.  Denies any vision changes or any feelings of any object stuck in the eye.  No recent eye trauma or injury.   Conjunctivitis       Home Medications Prior to Admission medications   Medication Sig Start Date End Date Taking? Authorizing Provider  amoxicillin-clavulanate (AUGMENTIN) 875-125 MG tablet Take 1 tablet by mouth every 12 (twelve) hours. 12/15/21   Melene Plan, DO  benzonatate (TESSALON) 100 MG capsule Take 1 capsule (100 mg total) by mouth every 8 (eight) hours. 12/15/21   Melene Plan, DO  naproxen (NAPROSYN) 500 MG tablet Take 1 tablet (500 mg total) by mouth 2 (two) times daily. 12/23/19   Shon Baton, MD  ondansetron (ZOFRAN-ODT) 4 MG disintegrating tablet 4mg  ODT q4 hours prn nausea/vomit 12/15/21   Melene Plan, DO  penicillin v potassium (VEETID) 500 MG tablet Take 1 tablet (500 mg total) by mouth 4 (four) times daily. 12/23/19   Horton, Mayer Masker, MD      Allergies    Doxycycline    Review of Systems   Review of Systems  Eyes:  Positive for redness. Negative for photophobia.  All other systems reviewed and are negative.   Physical Exam Updated Vital Signs BP 125/80 (BP Location: Left Arm)   Pulse 66   Temp 98 F (36.7 C) (Oral)   Resp 16   LMP 11/28/2010   SpO2 98%  Physical Exam Vitals and nursing note reviewed.  Constitutional:      Appearance: Normal appearance.   HENT:     Head: Normocephalic and atraumatic.  Eyes:     General: Allergic shiner present. No scleral icterus.       Right eye: No foreign body or discharge.        Left eye: No foreign body or discharge.     Extraocular Movements: Extraocular movements intact.     Conjunctiva/sclera: Conjunctivae normal.     Right eye: Right conjunctiva is not injected. No chemosis, exudate or hemorrhage.    Left eye: Left conjunctiva is not injected. No chemosis, exudate or hemorrhage.    Pupils: Pupils are equal, round, and reactive to light.     Comments: Eyes are largely unremarkable  Neurological:     Mental Status: She is alert.     ED Results / Procedures / Treatments   Labs (all labs ordered are listed, but only abnormal results are displayed) Labs Reviewed - No data to display  EKG None  Radiology No results found.  Procedures Procedures   Medications Ordered in ED Medications - No data to display  ED Course/ Medical Decision Making/ A&P                               Medical Decision Making  This patient presents to the ED for concern of conjunctivitis.  Differential  diagnosis includes viral conjunctivitis, bacterial conjunctivitis, allergic conjunctivitis, corneal injection, corneal abrasion  Problem List / ED Course:  Patient presented to the emergency department concerns of conjunctivitis.  Reports that he has been waking up for the last few mornings with "goop" in bilateral eyes.Reports some mild eye irritation but denies feeling like something is in eyes. No recent exposure to any individuals with any similar symptoms. Has not tried any medications for eye discomfort. Recently diagnosed with COVID-19 about 1 week ago and states symptoms are almost resolved, but has some lingering nasal congestion. On examination, bilateral eyes are unremarkable. No evidence of conjunctivitis and no obvious signs of occlusion of lacrimal ducts. Highly suspect allergic rhinitis vs viral  process from lingering COVID infection, but strongly doubt bacterial conjunctivitis. I do not believe that patient requires antibiotic drops to manage this, but should instead manage with OTC artificial tears and cold compresses as needed. A trial of antihistamine eye drops could also be beneficial and worth a trial. If symptoms worsen, advised patient to return to the ER for further evaluation. All questions answered prior to patient discharge.   Final Clinical Impression(s) / ED Diagnoses Final diagnoses:  Allergic conjunctivitis of both eyes    Rx / DC Orders ED Discharge Orders     None         Salomon Mast 09/24/22 2326    Charlynne Pander, MD 09/25/22 [phone]

## 2022-09-24 NOTE — ED Triage Notes (Signed)
Patient states she had eye goopy in her eyes this morning, states they were red this morning.  Work is not allowing her to come back until she gets her eyes checked.  She had Covid last week.  She is still congested.

## 2022-09-24 NOTE — ED Notes (Signed)
Discharge paperwork reviewed entirely with patient, including follow up care. Pain was under control. No prescriptions were called in, but all questions were addressed.  Pt verbalized understanding as well as all parties involved. No questions or concerns voiced at the time of discharge. No acute distress noted.   Pt ambulated out to PVA without incident or assistance.  

## 2022-09-24 NOTE — Discharge Instructions (Signed)
You were seen in the ER today for concerns of conjunctivitis. From examination, there does not appear to be anything obviously abnormal with your eyes, but it sounds like the issues are typically in the morning. Given that it involves both eyes and you recently were diagnosed with COVID-19, I doubt that this is bacterial in nature requiring antibiotic drops. You are not actively contagious at this time and are safe to go into work without restrictions, but be aware that the irritation may persist. You can try OTC antihistamine eye drops such as [place] or other types. If symptoms are worsening, please return to the ER.

## 2022-10-24 ENCOUNTER — Other Ambulatory Visit: Payer: Self-pay

## 2022-10-24 DIAGNOSIS — Z1231 Encounter for screening mammogram for malignant neoplasm of breast: Secondary | ICD-10-CM

## 2022-10-24 DIAGNOSIS — Z853 Personal history of malignant neoplasm of breast: Secondary | ICD-10-CM

## 2022-11-10 ENCOUNTER — Ambulatory Visit: Payer: 59

## 2022-12-08 ENCOUNTER — Inpatient Hospital Stay: Admission: RE | Admit: 2022-12-08 | Payer: 59 | Source: Ambulatory Visit

## 2022-12-08 ENCOUNTER — Ambulatory Visit: Payer: 59

## 2023-01-31 ENCOUNTER — Ambulatory Visit: Payer: 59

## 2023-11-12 ENCOUNTER — Encounter (HOSPITAL_BASED_OUTPATIENT_CLINIC_OR_DEPARTMENT_OTHER): Payer: Self-pay

## 2023-11-12 ENCOUNTER — Emergency Department (HOSPITAL_BASED_OUTPATIENT_CLINIC_OR_DEPARTMENT_OTHER)
Admission: EM | Admit: 2023-11-12 | Discharge: 2023-11-12 | Disposition: A | Discharge status: Home / Self Care | Payer: Self-pay | Attending: Emergency Medicine | Admitting: Emergency Medicine

## 2023-11-12 ENCOUNTER — Emergency Department (HOSPITAL_BASED_OUTPATIENT_CLINIC_OR_DEPARTMENT_OTHER): Payer: Self-pay

## 2023-11-12 ENCOUNTER — Other Ambulatory Visit: Payer: Self-pay

## 2023-11-12 DIAGNOSIS — J069 Acute upper respiratory infection, unspecified: Secondary | ICD-10-CM | POA: Insufficient documentation

## 2023-11-12 DIAGNOSIS — Z853 Personal history of malignant neoplasm of breast: Secondary | ICD-10-CM | POA: Insufficient documentation

## 2023-11-12 LAB — RESP PANEL BY RT-PCR (RSV, FLU A&B, COVID)  RVPGX2
Influenza A by PCR: NEGATIVE
Influenza B by PCR: NEGATIVE
Resp Syncytial Virus by PCR: NEGATIVE
SARS Coronavirus 2 by RT PCR: NEGATIVE

## 2023-11-12 LAB — GROUP A STREP BY PCR: Group A Strep by PCR: NOT DETECTED

## 2023-11-12 MED ORDER — PREDNISONE 20 MG PO TABS
40.0000 mg | ORAL_TABLET | Freq: Once | ORAL | Status: AC
  Administered 2023-11-12: 40 mg via ORAL
  Filled 2023-11-12: qty 2

## 2023-11-12 MED ORDER — PREDNISONE 10 MG PO TABS: ORAL_TABLET | ORAL | 0 refills | Status: AC

## 2023-11-12 MED ORDER — IBUPROFEN 400 MG PO TABS
600.0000 mg | ORAL_TABLET | Freq: Once | ORAL | Status: AC
  Administered 2023-11-12: 600 mg via ORAL
  Filled 2023-11-12: qty 1

## 2023-11-12 MED ORDER — BENZONATATE 100 MG PO CAPS
200.0000 mg | ORAL_CAPSULE | Freq: Once | ORAL | Status: AC
  Administered 2023-11-12: 200 mg via ORAL
  Filled 2023-11-12: qty 2

## 2023-11-12 NOTE — Discharge Instructions (Addendum)
 You appear to have an upper respiratory infection (URI). An upper respiratory tract infection, or cold, is a viral infection of the air passages leading to the lungs. It should improve gradually after 5-7 days. You may have a lingering cough that lasts for 2- 4 weeks after the infection.  Your illness is contagious and can be spread to others. It cannot be cured by antibiotics or other medicines. Take basic precautions such as washing your hands often, covering your mouth when you cough or sneeze, and avoiding public places where you could spread your illness to others.   Your flu, covid, and RSV test were negative today.  Your strep test is negative.  Your x-ray does not show any pneumonia.  Home care instructions:   You have been prescribed prednisone to help with lung inflammation. Please take this medication as prescribed for the next 7 days (40mg  on days 1 and 2, 30mg  on days 3 and 4, 20mg  on days 5 and 6, 10mg  on day 7).  You were given your first dose here today.  Take your next dose tomorrow morning.  Take this medication in the morning with breakfast, as taking it at night may make it hard to sleep. If you are a diabetic, please monitor your blood sugars closely on this medication, as it can cause your blood sugar to rise.  You can take Tylenol  and/or Ibuprofen as directed on the packaging for fever reduction and pain relief.    For cough: honey 1/2 to 1 teaspoon (you can dilute the honey in water or another fluid).  You can also use guaifenesin and dextromethorphan for cough which are over-the-counter medications. You can use a humidifier for chest congestion and cough.  If you don't have a humidifier, you can sit in the bathroom with the hot shower running.      For sore throat: try warm salt water gargles, cepacol lozenges, throat spray, warm tea or water with lemon/honey, popsicles or ice, or OTC cold relief medicine for throat discomfort.    For congestion: Flonase (Fluticasone) 1-2  sprays in each nostril daily. This is an over the counter medication.    It is important to stay hydrated: drink plenty of fluids (water, gatorade/powerade/pedialyte, juices, or teas) to keep your throat moisturized and help further relieve irritation/discomfort.   Follow-up instructions: Please follow-up with your primary care provider for further evaluation of your symptoms if you are not feeling better within the next 5 days.   Return instructions:  Please return to the Emergency Department if you experience worsening symptoms.  RETURN IMMEDIATELY IF you develop shortness of breath, confusion or altered mental status, a new rash, become dizzy, faint, or poorly responsive, or are unable to be cared for at home. Please return if you have persistent vomiting and cannot keep down fluids or develop a fever that is not controlled by tylenol  or motrin.   Please return if you have any other emergent concerns.

## 2023-11-12 NOTE — ED Provider Notes (Signed)
 [place] [facility] Provider Note   CSN: [id number] Arrival date & time: 11/12/23  8191     Patient presents with: Cough   Jane Diaz is a 53 y.o. female with history of breast cancer, presents with concern for persistent productive cough, runny nose and congestion, and a raspy voice that has been ongoing for the past 4 days.  She reports that she is coughing so much that will make her ribs hurt.  She denies any abdominal pain, nausea, vomiting, or diarrhea.  No fever or chills at home.  Denies any ear pain or throat pain.  She thought her symptoms were due to allergies and was taking allergy medication without relief of symptoms.    Cough      Prior to Admission medications   Medication Sig Start Date End Date Taking? Authorizing Provider  predniSONE (DELTASONE) 10 MG tablet Take 4 tablets (40 mg total) by mouth daily with breakfast for 1 day, THEN 3 tablets (30 mg total) daily with breakfast for 2 days, THEN 2 tablets (20 mg total) daily with breakfast for 2 days, THEN 1 tablet (10 mg total) daily with breakfast for 1 day. 11/13/23 11/19/23 Yes Veta Palma, PA-C  amoxicillin -clavulanate (AUGMENTIN ) 875-125 MG tablet Take 1 tablet by mouth every 12 (twelve) hours. 12/15/21   Emil Share, DO  benzonatate  (TESSALON ) 100 MG capsule Take 1 capsule (100 mg total) by mouth every 8 (eight) hours. 12/15/21   Emil Share, DO  naproxen  (NAPROSYN ) 500 MG tablet Take 1 tablet (500 mg total) by mouth 2 (two) times daily. 12/23/19   Bari Charmaine FALCON, MD  ondansetron  (ZOFRAN -ODT) 4 MG disintegrating tablet 4mg  ODT q4 hours prn nausea/vomit 12/15/21   Floyd, Dan, DO  penicillin  v potassium (VEETID) 500 MG tablet Take 1 tablet (500 mg total) by mouth 4 (four) times daily. 12/23/19   Horton, Charmaine FALCON, MD    Allergies: Doxycycline    Review of Systems  Respiratory:  Positive for cough.     Updated Vital Signs BP 126/87   Pulse 85   Temp 98.4 F (36.9  C) (Oral)   Resp 20   Ht 5' 4 (1.626 m)   Wt 86.2 kg   LMP 11/28/2010   SpO2 98%   BMI 32.61 kg/m   Physical Exam Vitals and nursing note reviewed.  Constitutional:      General: She is not in acute distress.    Appearance: She is well-developed.  HENT:     Head: Normocephalic and atraumatic.     Nose: Congestion present.     Mouth/Throat:     Pharynx: No oropharyngeal exudate or posterior oropharyngeal erythema.  Eyes:     Extraocular Movements: Extraocular movements intact.     Conjunctiva/sclera: Conjunctivae normal.     Pupils: Pupils are equal, round, and reactive to light.  Cardiovascular:     Rate and Rhythm: Normal rate and regular rhythm.     Heart sounds: No murmur heard. Pulmonary:     Effort: Pulmonary effort is normal. No respiratory distress.     Breath sounds: Normal breath sounds.     Comments: Persistent cough on exam Abdominal:     Palpations: Abdomen is soft.     Tenderness: There is no abdominal tenderness.  Musculoskeletal:        General: No swelling.     Cervical back: Neck supple.  Skin:    General: Skin is warm and dry.     Capillary Refill:  Capillary refill takes less than 2 seconds.  Neurological:     Mental Status: She is alert.  Psychiatric:        Mood and Affect: Mood normal.     (all labs ordered are listed, but only abnormal results are displayed) Labs Reviewed  GROUP A STREP BY PCR  RESP PANEL BY RT-PCR (RSV, FLU A&B, COVID)  [id number]    EKG: None  Radiology: DG Chest 2 View Result Date: 11/12/2023 EXAM: 2 VIEW(S) XRAY OF THE CHEST 11/12/2023 07:17:00 PM COMPARISON: None available. CLINICAL HISTORY: cough. Sore throat w/ cough and congestion x 3-4 days. Ex smoker. FINDINGS: LUNGS AND PLEURA: No focal pulmonary opacity. No pulmonary edema. No pleural effusion. No pneumothorax. HEART AND MEDIASTINUM: No acute abnormality of the cardiac and mediastinal silhouettes. BONES AND SOFT TISSUES: No acute osseous abnormality. IMPRESSION:  1. No acute abnormalities. Electronically signed by: Norman Gatlin MD 11/12/2023 07:26 PM EDT RP Workstation: [id number]     Procedures   Medications Ordered in the ED  ibuprofen (ADVIL) tablet 600 mg (600 mg Oral Given 11/12/23 1852)  benzonatate  (TESSALON ) capsule 200 mg (200 mg Oral Given 11/12/23 1852)  predniSONE (DELTASONE) tablet 40 mg (40 mg Oral Given 11/12/23 2129)                                    Medical Decision Making Amount and/or Complexity of Data Reviewed Radiology: ordered.  Risk Prescription drug management.     Differential diagnosis includes but is not limited to COVID, flu, RSV, viral URI, strep pharyngitis, viral pharyngitis, allergic rhinitis, pneumonia, bronchitis   ED Course:  Upon initial evaluation, patient appears ill, but no acute distress.  She is having a persistent cough on exam.  I can hear the mucus in her lungs on exam, but this clears with coughing.  She is talking in full sentences on room air without difficulty.  Maintaining 98% oxygen saturation on room air.  Voice sounds raspy.  Abdomen is soft and nontender.  Posterior oropharynx without any erythema or tonsillar exudate.  Labs Ordered: I Ordered, and personally interpreted labs.  The pertinent results include:   COVID, flu, RSV, strep negative  Imaging Studies ordered: I ordered imaging studies including chest x-ray I independently visualized the imaging with scope of interpretation limited to determining acute life threatening conditions related to emergency care. Imaging showed no acute abnormality I agree with the radiologist interpretation  Medications Given: Tessalon  Perles Prednisone Ibuprofen  Upon re-evaluation, patient remains with stable vitals.  We discussed that her COVID, flu, RSV, and strep testing was negative.  Chest x-ray is without any consolidations, she is afebrile, lower concern for pneumonia at this time.  Symptoms of cough, congestion, raspy voice are  consistent with viral URI.  Stable and appropriate for discharge home.  Impression: Viral URI  Disposition:  Discharged home with instructions to use over-the-counter medications as needed for symptom control.  Prednisone as prescribed.  Follow-up with PCP if symptoms not improving within the next 5 days. Return precautions given.    This chart was dictated using voice recognition software, Dragon. Despite the best efforts of this provider to proofread and correct errors, errors may still occur which can change documentation meaning.       Final diagnoses:  Viral URI with cough    ED Discharge Orders          Ordered    predniSONE (DELTASONE)  10 MG tablet  Q breakfast        11/12/23 2121               Veta Palma, PA-C 11/12/23 2139    Ruthe Cornet, DO 11/12/23 2212

## 2023-11-12 NOTE — ED Triage Notes (Signed)
 Pt reports they have been feeling bad since last Thursday. C.o cough with green mucus, eye pain, rib pain, and runny nose. Also endorses headache and raspy voice.
